# Patient Record
Sex: Male | Born: 1990 | Race: Black or African American | Hispanic: No | Marital: Single | Smoking: Former smoker
Health system: Southern US, Community
[De-identification: ages and names within clinical notes are randomized; demographics above are authoritative.]

---
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
## 2009-10-19 ENCOUNTER — Emergency Department (HOSPITAL_COMMUNITY): Admission: EM | Admit: 2009-10-19 | Discharge: 2009-10-19 | Payer: Self-pay | Admitting: Emergency Medicine

## 2010-10-15 ENCOUNTER — Inpatient Hospital Stay (INDEPENDENT_AMBULATORY_CARE_PROVIDER_SITE_OTHER)
Admission: RE | Admit: 2010-10-15 | Discharge: 2010-10-15 | Disposition: A | Discharge status: Home / Self Care | Payer: Self-pay | Source: Ambulatory Visit | Attending: Family Medicine | Admitting: Family Medicine

## 2010-10-15 DIAGNOSIS — R51 Headache: Secondary | ICD-10-CM

## 2011-01-15 IMAGING — CR DG ANKLE COMPLETE 3+V*L*
3 series · 3 of 3 positions shown · non-contrast
Comparison: None.

CLINICAL DATA: Status post left ankle injury, with lateral ankle
pain and swelling.

LEFT ANKLE COMPLETE - 3+ VIEW

[t ankle joint ap left]
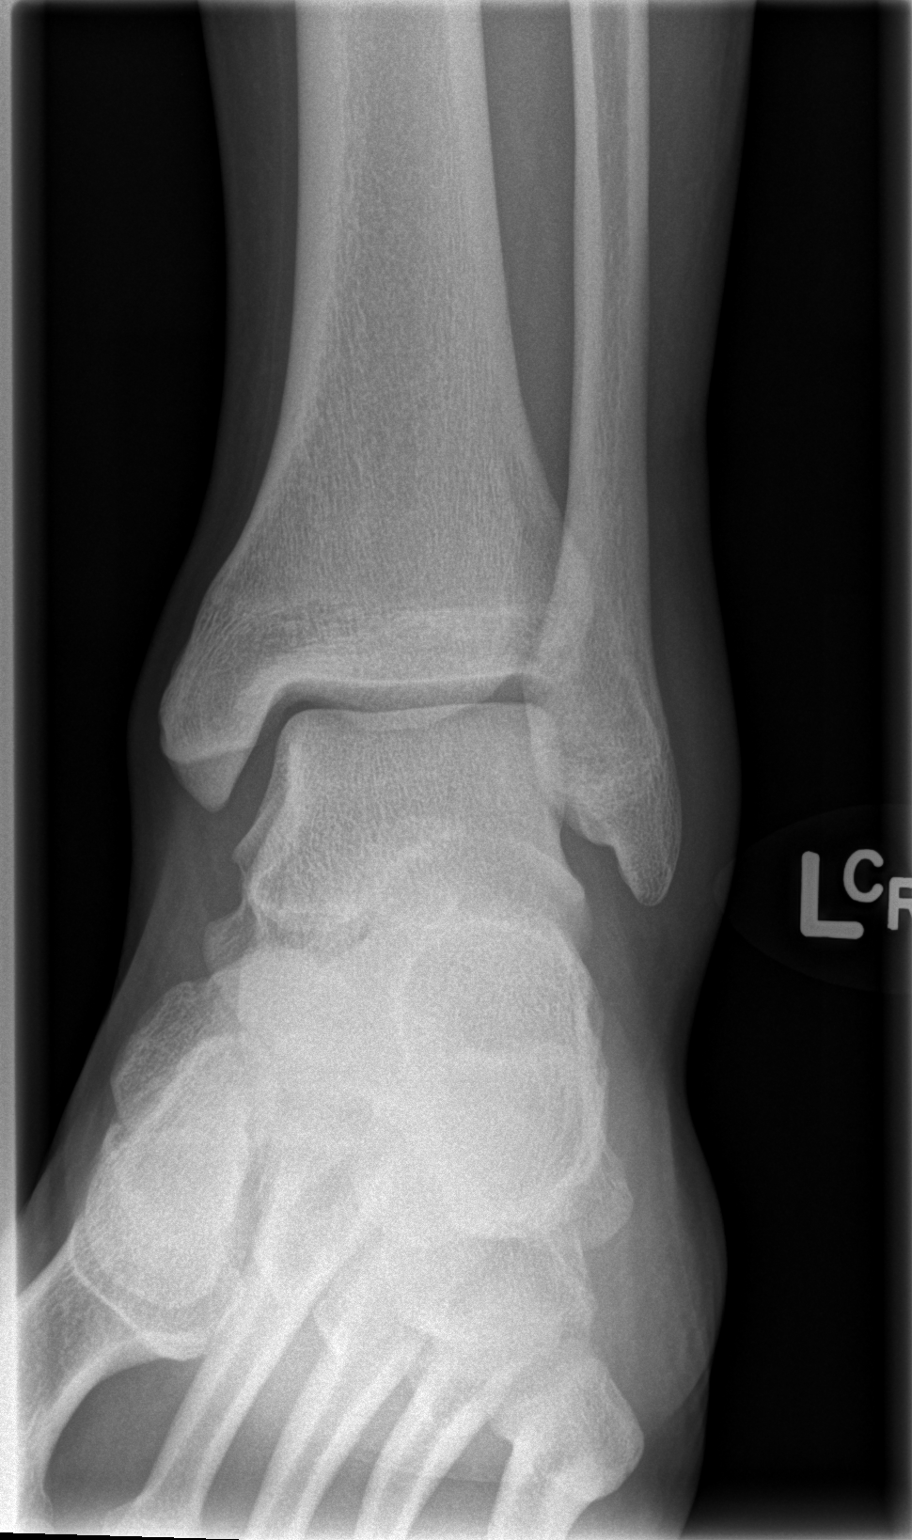

[t ankle joint oblique left *]
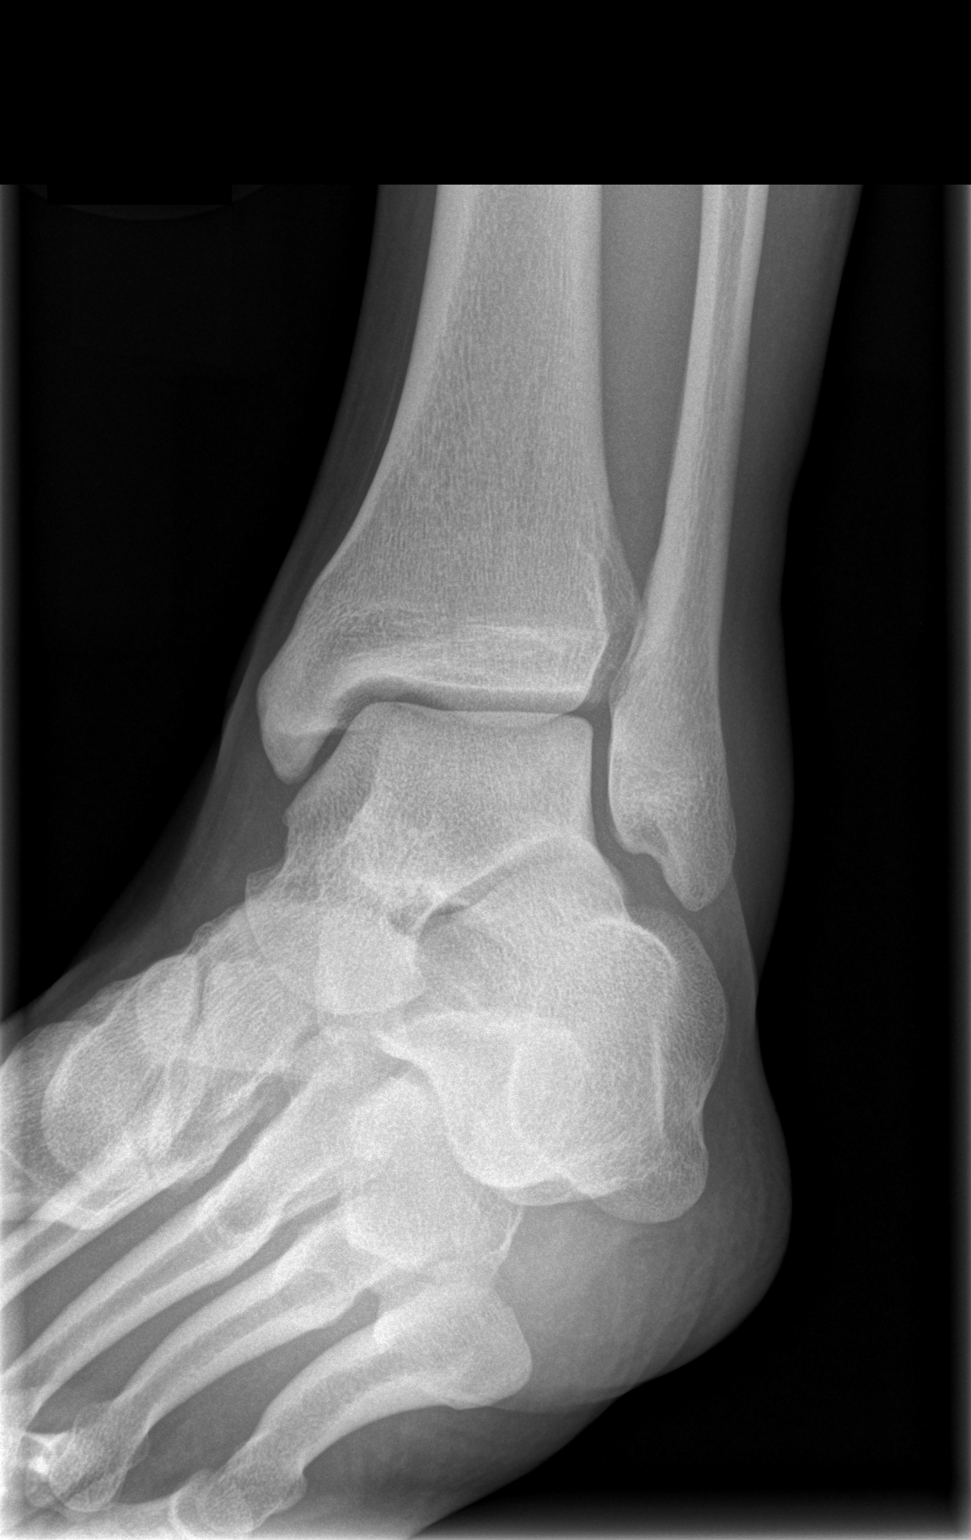

[t ankle joint lat left]
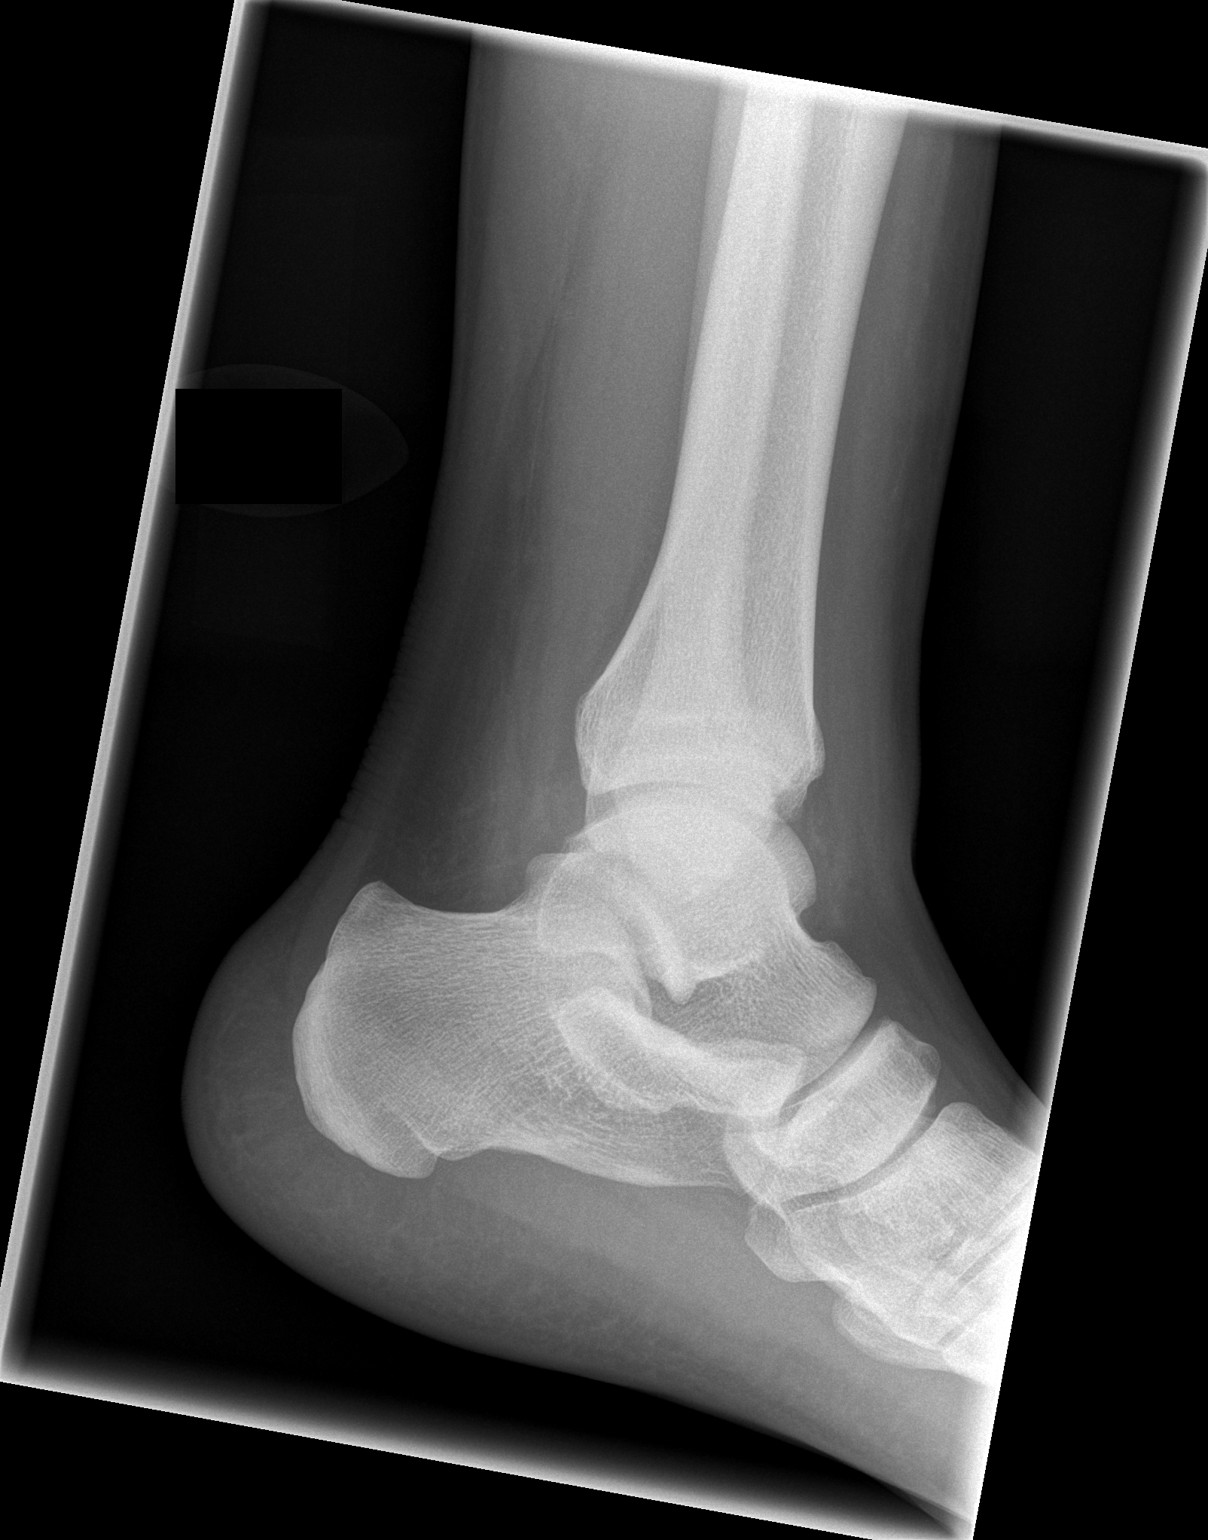

[3 of 3 positions shown; findings below may reference images not displayed]

FINDINGS: There is no evidence of fracture or dislocation.  The
ankle mortise is intact; the interosseous space is within normal
limits.  No talar tilt or subluxation is seen.

The joint spaces are preserved.  Mild lateral soft tissue swelling
is noted.
IMPRESSION: No evidence of fracture or dislocation.

## 2012-11-20 ENCOUNTER — Encounter (HOSPITAL_COMMUNITY): Payer: Self-pay | Admitting: Emergency Medicine

## 2012-11-20 ENCOUNTER — Emergency Department (HOSPITAL_COMMUNITY)
Admission: EM | Admit: 2012-11-20 | Discharge: 2012-11-20 | Disposition: A | Discharge status: Home / Self Care | Payer: Self-pay | Attending: Emergency Medicine | Admitting: Emergency Medicine

## 2012-11-20 DIAGNOSIS — H9209 Otalgia, unspecified ear: Secondary | ICD-10-CM | POA: Insufficient documentation

## 2012-11-20 DIAGNOSIS — R6889 Other general symptoms and signs: Secondary | ICD-10-CM | POA: Insufficient documentation

## 2012-11-20 DIAGNOSIS — H6120 Impacted cerumen, unspecified ear: Secondary | ICD-10-CM | POA: Insufficient documentation

## 2012-11-20 DIAGNOSIS — H9203 Otalgia, bilateral: Secondary | ICD-10-CM

## 2012-11-20 NOTE — ED Notes (Signed)
Pt c/o bilateral ear pain. States his ears hurt when he swallow.

## 2012-11-20 NOTE — ED Provider Notes (Signed)
CSN: [id number]     Arrival date & time 11/20/12  1457 History    This chart was scribed for James Morn, NP working with Shelda Jakes, MD by Quintella Reichert, ED Scribe. This patient was seen in room TR04C/TR04C and the patient's care was started at 3:49 PM.     Chief Complaint  Patient presents with  . Otalgia    Patient is a 22 y.o. male presenting with ear pain. The history is provided by the patient. No language interpreter was used.  Otalgia Location:  Bilateral Behind ear:  No abnormality Severity:  Moderate Duration:  16 hours Timing: Only on swalliowing. Chronicity:  New Context: not direct blow, not elevation change, not foreign body in ear, not loud noise and no water in ear   Relieved by:  None tried Worsened by:  Nothing tried Ineffective treatments:  None tried Associated symptoms: no congestion, no ear discharge, no fever, no hearing loss, no rhinorrhea, no sore throat and no tinnitus   Associated symptoms comment:  Sneezing Risk factors: no recent travel and no chronic ear infection     HPI Comments: James Love is a 22 y.o. male who presents to the Emergency Department complaining of moderate bilateral ear pain on swallowing that began last night.  Pt states that he only experiences pain when he swallows.  He denies exposure to loud noises or obvious injury prior to onset of pain.  He denies h/o similar symptoms.  He also notes that he has been sneezing some.  He denies sore throat, rhinorrhea or any other associated symptoms.  Pt denies chronic medical conditions or regular medication usage.  He denies medication allergies.  Pt has no PCP  History reviewed. No pertinent past medical history.   History reviewed. No pertinent past surgical history.   No family history on file.   History  Substance Use Topics  . Smoking status: Never Smoker   . Smokeless tobacco: Not on file  . Alcohol Use: Yes     Comment: occasinally     Review of Systems   Constitutional: Negative for fever.  HENT: Positive for ear pain. Negative for hearing loss, congestion, sore throat, rhinorrhea, tinnitus and ear discharge.   All other systems reviewed and are negative.      Allergies  Review of patient's allergies indicates no known allergies.  Home Medications   Current Outpatient Rx  Name  Route  Sig  Dispense  Refill  . acetaminophen (TYLENOL) 325 MG tablet   Oral   Take 650 mg by mouth every 6 (six) hours as needed for pain.          BP 139/64  Pulse 72  Temp(Src) 98.7 F (37.1 C) (Oral)  SpO2 100%  Physical Exam  Nursing note and vitals reviewed. Constitutional: He appears well-developed and well-nourished. No distress.  HENT:  Head: Normocephalic and atraumatic.  Right Ear: Tympanic membrane normal.  Left Ear: Tympanic membrane normal.  Wax build-up in both canals.  Neck: Neck supple.  Cardiovascular: Normal rate.   Pulmonary/Chest: Effort normal. No respiratory distress.  Lymphadenopathy:    He has no cervical adenopathy.  Neurological: He is alert.  Skin: Skin is warm and dry.  Psychiatric: He has a normal mood and affect. His behavior is normal.    ED Course  Procedures (including critical care time)  DIAGNOSTIC STUDIES: Oxygen Saturation is 100% on room air, normal by my interpretation.    COORDINATION OF CARE: 3:53 PM-Informed pt that pain  is likely due to ear wax impaction.  Discussed treatment plan which includes f/u with ENT if symptoms do not improve with pt at bedside and pt agreed to plan.     Labs Reviewed - No data to display  No results found.  No diagnosis found.   MDM  TM's clear without erythema.  Pain only present intermittently with swallowing.  Doubt otitis media, more likely ETD.     I personally performed the services described in this documentation, which was scribed in my presence. The recorded information has been reviewed and is accurate.   Jimmye Norman, NP 11/20/12  [phone]

## 2012-11-23 NOTE — ED Provider Notes (Signed)
Medical screening examination/treatment/procedure(s) were performed by non-physician practitioner and as supervising physician I was immediately available for consultation/collaboration.  Shelda Jakes, MD 11/23/12 2220

## 2013-01-07 ENCOUNTER — Emergency Department (HOSPITAL_COMMUNITY)
Admission: EM | Admit: 2013-01-07 | Discharge: 2013-01-07 | Disposition: A | Discharge status: Home / Self Care | Payer: Self-pay | Attending: Emergency Medicine | Admitting: Emergency Medicine

## 2013-01-07 ENCOUNTER — Encounter (HOSPITAL_COMMUNITY): Payer: Self-pay | Admitting: *Deleted

## 2013-01-07 DIAGNOSIS — J069 Acute upper respiratory infection, unspecified: Secondary | ICD-10-CM | POA: Insufficient documentation

## 2013-01-07 DIAGNOSIS — Z87891 Personal history of nicotine dependence: Secondary | ICD-10-CM | POA: Insufficient documentation

## 2013-01-07 MED ORDER — ACETAMINOPHEN 325 MG PO TABS
650.0000 mg | ORAL_TABLET | Freq: Once | ORAL | Status: AC
Start: 2013-01-07 — End: 2013-01-07
  Administered 2013-01-07: 650 mg via ORAL
  Filled 2013-01-07: qty 2

## 2013-01-07 NOTE — ED Provider Notes (Signed)
Medical screening examination/treatment/procedure(s) were performed by non-physician practitioner and as supervising physician I was immediately available for consultation/collaboration.  Koven Belinsky M Teaira Croft, MD 01/07/13 0524 

## 2013-01-07 NOTE — ED Provider Notes (Addendum)
CSN: [id number]     Arrival date & time 01/07/13  0326 History   First MD Initiated Contact with Patient 01/07/13 0400     Chief Complaint  Patient presents with  . Chills   (Consider location/radiation/quality/duration/timing/severity/associated sxs/prior Treatment) HPI Comments: Patient states he's had URI symptoms for the past couple days went to bed in his normal state of health despite nasal congestion and rhinitis but was awakened at 2:30  with a chill, he did not know what was happening he became frightened and came immediately to the emergency department for evaluation the chill has passed.   The history is provided by the patient.    History reviewed. No pertinent past medical history. History reviewed. No pertinent past surgical history. History reviewed. No pertinent family history. History  Substance Use Topics  . Smoking status: Former Smoker    Quit date: 01/07/2010  . Smokeless tobacco: Not on file  . Alcohol Use: No    Review of Systems  Constitutional: Positive for chills.  HENT: Positive for rhinorrhea.   Respiratory: Negative for cough.   Cardiovascular: Negative for chest pain.  Gastrointestinal: Negative for nausea.  Neurological: Negative for headaches.  All other systems reviewed and are negative.    Allergies  Review of patient's allergies indicates no known allergies.  Home Medications   Current Outpatient Rx  Name  Route  Sig  Dispense  Refill  . acetaminophen (TYLENOL) 325 MG tablet   Oral   Take 650 mg by mouth every 6 (six) hours as needed for pain.          BP 146/59  Pulse 94  Temp(Src) 100.6 F (38.1 C) (Oral)  Resp 16  Ht 6\' 2"  (1.88 m)  Wt 220 lb (99.791 kg)  BMI 28.23 kg/m2  SpO2 99% Physical Exam  Nursing note and vitals reviewed. Constitutional: He appears well-developed and well-nourished.  HENT:  Head: Normocephalic.  Right Ear: External ear normal.  Left Ear: External ear normal.  Mouth/Throat: Oropharynx is  clear and moist.  Eyes: Pupils are equal, round, and reactive to light.  Neck: Normal range of motion.  Cardiovascular: Normal rate and regular rhythm.   Pulmonary/Chest: Effort normal and breath sounds normal. No respiratory distress. He has no wheezes.  Abdominal: Soft. Bowel sounds are normal. He exhibits no distension.  Musculoskeletal: Normal range of motion. He exhibits no edema and no tenderness.  Lymphadenopathy:    He has no cervical adenopathy.  Neurological: He is alert.  Skin: Skin is warm. No rash noted. No pallor.    ED Course  Procedures (including critical care time) Labs Review Labs Reviewed - No data to display Imaging Review No results found.  MDM   1. URI (upper respiratory infection)    Will treat low grade temp with tylenol and reassure patient     Arman Filter, NP 01/07/13 0427  Arman Filter, NP 01/07/13 [phone]

## 2013-01-07 NOTE — ED Provider Notes (Signed)
Medical screening examination/treatment/procedure(s) were performed by non-physician practitioner and as supervising physician I was immediately available for consultation/collaboration.  Olivia Mackie, MD 01/07/13 [phone]

## 2013-01-07 NOTE — ED Notes (Signed)
Pt states he woke up with chills and pt felt like he had a fever.

## 2014-04-18 ENCOUNTER — Encounter (HOSPITAL_COMMUNITY): Payer: Self-pay | Admitting: Emergency Medicine

## 2014-04-18 ENCOUNTER — Emergency Department (HOSPITAL_COMMUNITY)
Admission: EM | Admit: 2014-04-18 | Discharge: 2014-04-18 | Disposition: A | Discharge status: Home / Self Care | Payer: Self-pay | Attending: Emergency Medicine | Admitting: Emergency Medicine

## 2014-04-18 DIAGNOSIS — R300 Dysuria: Secondary | ICD-10-CM | POA: Insufficient documentation

## 2014-04-18 DIAGNOSIS — Z87891 Personal history of nicotine dependence: Secondary | ICD-10-CM | POA: Insufficient documentation

## 2014-04-18 LAB — URINALYSIS, ROUTINE W REFLEX MICROSCOPIC
BILIRUBIN URINE: NEGATIVE
Glucose, UA: NEGATIVE mg/dL
HGB URINE DIPSTICK: NEGATIVE
Ketones, ur: NEGATIVE mg/dL
Leukocytes, UA: NEGATIVE
Nitrite: NEGATIVE
PH: 7 (ref 5.0–8.0)
PROTEIN: NEGATIVE mg/dL
SPECIFIC GRAVITY, URINE: 1.019 (ref 1.005–1.030)
Urobilinogen, UA: 0.2 mg/dL (ref 0.0–1.0)

## 2014-04-18 MED ORDER — CEFTRIAXONE SODIUM 250 MG IJ SOLR
250.0000 mg | Freq: Once | INTRAMUSCULAR | Status: AC
  Administered 2014-04-18: 250 mg via INTRAMUSCULAR
  Filled 2014-04-18: qty 250

## 2014-04-18 MED ORDER — AZITHROMYCIN 250 MG PO TABS
1000.0000 mg | ORAL_TABLET | Freq: Once | ORAL | Status: AC
  Administered 2014-04-18: 1000 mg via ORAL
  Filled 2014-04-18: qty 4

## 2014-04-18 NOTE — Discharge Instructions (Signed)

## 2014-04-18 NOTE — ED Provider Notes (Signed)
CSN: [id number]     Arrival date & time 04/18/14  1418 History  This chart was scribed for James Helper, PA-C, working with Suzi Roots, MD by Elon Spanner, ED Scribe. This patient was seen in room WTR9/WTR9 and the patient's care was started at 3:25 PM.    Chief Complaint  Patient presents with  . SEXUALLY TRANSMITTED DISEASE   The history is provided by the patient. No language interpreter was used.   HPI Comments: IANN RODIER is a 24 y.o. male who presents to the Emergency Department complaining of increased urinary frequency onset 2 days ago with associated urgency, mild dysuria, and mild lower back pain.  Patient reports 1 sexual partner within the past 6 months and denies using a condom Patient denies history of STD.  Patient denies testicular pain, abdominal pain, vomiting, rash, constipation, blood in urine.    History reviewed. No pertinent past medical history. No past surgical history on file. No family history on file. History  Substance Use Topics  . Smoking status: Former Smoker    Quit date: 01/07/2010  . Smokeless tobacco: Not on file  . Alcohol Use: No    Review of Systems  Genitourinary: Positive for dysuria, urgency and frequency. Negative for discharge, penile swelling, scrotal swelling, penile pain and testicular pain.  Musculoskeletal: Positive for back pain.      Allergies  Review of patient's allergies indicates no known allergies.  Home Medications   Prior to Admission medications   Medication Sig Start Date End Date Taking? Authorizing Provider  acetaminophen (TYLENOL) 325 MG tablet Take 650 mg by mouth every 6 (six) hours as needed for pain.    Historical Provider, MD   BP 144/54 mmHg  Pulse 83  Temp(Src) 98.7 F (37.1 C) (Oral)  Resp 18  SpO2 100% Physical Exam  Constitutional: He is oriented to person, place, and time. He appears well-developed and well-nourished. No distress.  HENT:  Head: Normocephalic and atraumatic.  Eyes:  Conjunctivae and EOM are normal.  Neck: Neck supple. No tracheal deviation present.  Cardiovascular: Normal rate.   Pulmonary/Chest: Effort normal. No respiratory distress.  Abdominal: Soft. Bowel sounds are normal. He exhibits no distension. There is no tenderness.  No hernia  Genitourinary:  Penis is circumcised without lesions.  Testicle is non-tender.  Normal testicular lie.  No inguinal hernia.  No CVA tenderness.     Musculoskeletal: Normal range of motion.  Neurological: He is alert and oriented to person, place, and time.  Skin: Skin is warm and dry.  Psychiatric: He has a normal mood and affect. His behavior is normal.  Nursing note and vitals reviewed.   ED Course  Procedures (including critical care time)  DIAGNOSTIC STUDIES: Oxygen Saturation is 100% on RA, normal by my interpretation.    COORDINATION OF CARE:  3:29 PM Will order culture and UA.  Patient acknowledges and agrees with plan.    4:08 PM Patient here with urinary frequency and urgency. No polydipsia to suggest diabetes. Urine shows no evidence of urinary tract infection. Patient is sexually active and not using protection has increased risk of possible STD. GC and chlamydia culture sent along with urine culture.  Pt however request to be treated for possible STD.  Will give Rocephin/zithromax.  Safe sex practice discussed.  Return precaution discussed.  Labs Review Labs Reviewed  GC/CHLAMYDIA PROBE AMP  URINE CULTURE  URINALYSIS, ROUTINE W REFLEX MICROSCOPIC    Imaging Review No results found.   EKG Interpretation  None      MDM   Final diagnoses:  Dysuria    BP 144/54 mmHg  Pulse 83  Temp(Src) 98.7 F (37.1 C) (Oral)  Resp 18  SpO2 100%  I personally performed the services described in this documentation, which was scribed in my presence. The recorded information has been reviewed and is accurate.    James Helper, PA-C 04/18/14 1614  Audree Camel, MD 04/18/14 [phone]

## 2014-04-18 NOTE — ED Notes (Signed)
Pt c/o urinary frequency and dysuria x 2 days.  Denies discharge.

## 2014-04-22 ENCOUNTER — Telehealth (HOSPITAL_BASED_OUTPATIENT_CLINIC_OR_DEPARTMENT_OTHER): Payer: Self-pay | Admitting: Emergency Medicine

## 2019-01-16 ENCOUNTER — Other Ambulatory Visit: Payer: Self-pay

## 2019-01-16 DIAGNOSIS — Z20822 Contact with and (suspected) exposure to covid-19: Secondary | ICD-10-CM

## 2019-01-17 LAB — NOVEL CORONAVIRUS, NAA: SARS-CoV-2, NAA: NOT DETECTED

## 2020-04-18 ENCOUNTER — Other Ambulatory Visit: Payer: Self-pay

## 2020-04-18 ENCOUNTER — Emergency Department (HOSPITAL_COMMUNITY)
Admission: EM | Admit: 2020-04-18 | Discharge: 2020-04-19 | Disposition: A | Discharge status: Home / Self Care | Payer: HRSA Program | Attending: Emergency Medicine | Admitting: Emergency Medicine

## 2020-04-18 DIAGNOSIS — U071 COVID-19: Secondary | ICD-10-CM | POA: Insufficient documentation

## 2020-04-18 DIAGNOSIS — Z5321 Procedure and treatment not carried out due to patient leaving prior to being seen by health care provider: Secondary | ICD-10-CM | POA: Insufficient documentation

## 2020-04-18 DIAGNOSIS — R059 Cough, unspecified: Secondary | ICD-10-CM | POA: Diagnosis present

## 2020-04-18 LAB — RESP PANEL BY RT-PCR (FLU A&B, COVID) ARPGX2
Influenza A by PCR: NEGATIVE
Influenza B by PCR: NEGATIVE
SARS Coronavirus 2 by RT PCR: POSITIVE — AB

## 2020-04-18 NOTE — ED Notes (Signed)
Patient moved to appropriate waiting area 

## 2020-04-18 NOTE — ED Triage Notes (Signed)
Pt presents to ED POV. Pt c/o HA, cough, fatigue. Pt was exposed to covid. Pt is vaccinated
